# Patient Record
Sex: Male | Born: 1990 | Hispanic: No | Marital: Single | Smoking: Current every day smoker
Health system: Southern US, Community
[De-identification: ages and names within clinical notes are randomized; demographics above are authoritative.]

---
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
## 2011-05-18 ENCOUNTER — Emergency Department: Payer: Self-pay | Admitting: Internal Medicine

## 2011-07-13 ENCOUNTER — Emergency Department: Payer: Self-pay | Admitting: Unknown Physician Specialty

## 2016-11-20 ENCOUNTER — Emergency Department
Admission: EM | Admit: 2016-11-20 | Discharge: 2016-11-20 | Disposition: A | Discharge status: Home / Self Care | Payer: BLUE CROSS/BLUE SHIELD | Attending: Emergency Medicine | Admitting: Emergency Medicine

## 2016-11-20 DIAGNOSIS — R05 Cough: Secondary | ICD-10-CM | POA: Diagnosis present

## 2016-11-20 DIAGNOSIS — J069 Acute upper respiratory infection, unspecified: Secondary | ICD-10-CM

## 2016-11-20 DIAGNOSIS — Z87891 Personal history of nicotine dependence: Secondary | ICD-10-CM | POA: Diagnosis not present

## 2016-11-20 DIAGNOSIS — B9789 Other viral agents as the cause of diseases classified elsewhere: Secondary | ICD-10-CM

## 2016-11-20 MED ORDER — BENZONATATE 100 MG PO CAPS: ORAL_CAPSULE | ORAL | 0 refills | Status: DC

## 2016-11-20 MED ORDER — IBUPROFEN 800 MG PO TABS: 800.0000 mg | ORAL_TABLET | Freq: Three times a day (TID) | ORAL | 0 refills | Status: DC

## 2016-11-20 NOTE — ED Triage Notes (Signed)
Pt c/o cough with congestion and upper back pain with the cough for the past 3-4 days.

## 2016-11-20 NOTE — Discharge Instructions (Signed)
Follow-up with your primary care doctor or the clinic if any continued problems. Increase fluids. Ibuprofen 800 mg 3 times a day with food as needed for pain. Tessalon Perles 1 or 2 every 8 hours as needed for cough.

## 2016-11-20 NOTE — ED Notes (Signed)
Pt states "when I cough my back hurts. It feels like it's in my lungs." Pt states symptoms x 3 days. Denies coughing up blood. States pain with deep breath. Pt alert and oriented.

## 2016-11-20 NOTE — ED Provider Notes (Signed)
[facility] Emergency Department Provider Note   ____________________________________________   First MD Initiated Contact with Patient 11/20/16 1155     (approximate)  I have reviewed the triage vital signs and the nursing notes.   HISTORY  Chief Complaint Cough    HPI Alejandro Mcdonald is a 26 y.o. male is here with complaint of cough and congestion for the last 3-4 days. He also has some upper back pain with the cough. He denies any known fever or chills. He states that he discontinued smoking approximately 2 years ago. He denies any known bronchitis or pneumonia in the past but is concerned about both. Patient has not been taking any over-the-counter medication for his symptoms. Currently rates his pain as 6/10.   History reviewed. No pertinent past medical history.  There are no active problems to display for this patient.   History reviewed. No pertinent surgical history.  Prior to Admission medications   Medication Sig Start Date End Date Taking? Authorizing Provider  benzonatate (TESSALON PERLES) 100 MG capsule Take 1 or 2 q 8 h prn cough 11/20/16   Tommi Rumps, PA-C  ibuprofen (ADVIL,MOTRIN) 800 MG tablet Take 1 tablet (800 mg total) by mouth 3 (three) times daily. 11/20/16   Tommi Rumps, PA-C    Allergies Bee venom and Shellfish allergy  No family history on file.  Social History Social History  Substance Use Topics  . Smoking status: Former Games developer  . Smokeless tobacco: Never Used  . Alcohol use No    Review of Systems Constitutional: No fever/chills ENT: No sore throat.No ear pain. Cardiovascular: Denies chest pain. Respiratory: Denies shortness of breath. Positive cough. Gastrointestinal: No abdominal pain.  No nausea, no vomiting.   Musculoskeletal: Negative for back pain. Skin: Negative for rash. Neurological: Negative for headaches, focal weakness or numbness.  10-point ROS otherwise  negative.  ____________________________________________   PHYSICAL EXAM:  VITAL SIGNS: ED Triage Vitals  Enc Vitals Group     BP 11/20/16 1038 (!) 141/84     Pulse Rate 11/20/16 1038 78     Resp 11/20/16 1038 18     Temp 11/20/16 1038 98.2 F (36.8 C)     Temp Source 11/20/16 1038 Oral     SpO2 11/20/16 1038 97 %     Weight 11/20/16 1038 (!) 320 lb (145.2 kg)     Height 11/20/16 1038 5\' 6"  (1.676 m)     Head Circumference --      Peak Flow --      Pain Score 11/20/16 1039 6     Pain Loc --      Pain Edu? --      Excl. in GC? --     Constitutional: Alert and oriented. Well appearing and in no acute distress. Eyes: Conjunctivae are normal. PERRL. EOMI. Head: Atraumatic. Nose: No congestion/rhinnorhea. EACs are clear bilaterally. TMs are dull. No erythema or injection seen. Mouth/Throat: Mucous membranes are moist.  Oropharynx non-erythematous. Neck: No stridor.   Hematological/Lymphatic/Immunilogical: No cervical lymphadenopathy. Cardiovascular: Normal rate, regular rhythm. Grossly normal heart sounds.  Good peripheral circulation. Respiratory: Normal respiratory effort.  No retractions. Lungs CTAB. Gastrointestinal: Soft and nontender. No distention.  Musculoskeletal: No lower extremity tenderness nor edema.  No joint effusions. Neurologic:  Normal speech and language. No gross focal neurologic deficits are appreciated. No gait instability. Skin:  Skin is warm, dry and intact. No rash noted. Psychiatric: Mood and affect are normal. Speech and behavior are normal.  ____________________________________________   LABS (all labs ordered are listed, but only abnormal results are displayed)  Labs Reviewed - No data to display  PROCEDURES  Procedure(s) performed: None  Procedures  Critical Care performed: No  ____________________________________________   INITIAL IMPRESSION / ASSESSMENT AND PLAN / ED COURSE  Pertinent labs & imaging results that were available  during my care of the patient were reviewed by me and considered in my medical decision making (see chart for details).  Patient is given prescription for Tessalon Perles 1 or 2 every 8 hours as needed for cough. He is also given a prescription for ibuprofen 800 mg 3 times a day with food. He was reassured that he does not have influenza but most likely a viral upper respiratory infection. He is to increase fluids and follow-up with [facility] if any continued problems.      ____________________________________________   FINAL CLINICAL IMPRESSION(S) / ED DIAGNOSES  Final diagnoses:  Viral URI with cough      NEW MEDICATIONS STARTED DURING THIS VISIT:  Discharge Medication List as of 11/20/2016 12:32 PM    START taking these medications   Details  benzonatate (TESSALON PERLES) 100 MG capsule Take 1 or 2 q 8 h prn cough, Print    ibuprofen (ADVIL,MOTRIN) 800 MG tablet Take 1 tablet (800 mg total) by mouth 3 (three) times daily., Starting Wed 11/20/2016, Print         Note:  This document was prepared using Dragon voice recognition software and may include unintentional dictation errors.    Tommi Rumpshonda L Kanyon Bunn, PA-C 11/20/16 1317    Jeanmarie PlantJames A McShane, MD 11/20/16 [phone]

## 2018-01-08 ENCOUNTER — Emergency Department
Admission: EM | Admit: 2018-01-08 | Discharge: 2018-01-08 | Disposition: A | Discharge status: Home / Self Care | Payer: BLUE CROSS/BLUE SHIELD | Attending: Emergency Medicine | Admitting: Emergency Medicine

## 2018-01-08 ENCOUNTER — Other Ambulatory Visit: Payer: Self-pay

## 2018-01-08 ENCOUNTER — Emergency Department: Payer: BLUE CROSS/BLUE SHIELD

## 2018-01-08 DIAGNOSIS — M722 Plantar fascial fibromatosis: Secondary | ICD-10-CM | POA: Diagnosis not present

## 2018-01-08 DIAGNOSIS — Z79899 Other long term (current) drug therapy: Secondary | ICD-10-CM | POA: Insufficient documentation

## 2018-01-08 DIAGNOSIS — M79671 Pain in right foot: Secondary | ICD-10-CM | POA: Diagnosis present

## 2018-01-08 DIAGNOSIS — Z87891 Personal history of nicotine dependence: Secondary | ICD-10-CM | POA: Diagnosis not present

## 2018-01-08 MED ORDER — TRAMADOL HCL 50 MG PO TABS: 50.0000 mg | ORAL_TABLET | Freq: Four times a day (QID) | ORAL | 0 refills | Status: AC | PRN

## 2018-01-08 MED ORDER — MELOXICAM 15 MG PO TABS: 15.0000 mg | ORAL_TABLET | Freq: Every day | ORAL | 0 refills | Status: DC

## 2018-01-08 NOTE — ED Provider Notes (Signed)
[facility] Emergency Department Provider Note ____________________________________________  Time seen: Approximately 3:23 PM  I have reviewed the triage vital signs and the nursing notes.   HISTORY  Chief Complaint Foot Pain    HPI Alejandro Mcdonald is a 27 y.o. male who presents to the emergency department for evaluation and treatment of who presents to the emergency department for treatment and evaluation of right heel pain. No specific injury. Pain is worse in the morning. No relief with ibuprofen.  History reviewed. No pertinent past medical history.  There are no active problems to display for this patient.   History reviewed. No pertinent surgical history.  Prior to Admission medications   Medication Sig Start Date End Date Taking? Authorizing Provider  benzonatate (TESSALON PERLES) 100 MG capsule Take 1 or 2 q 8 h prn cough 11/20/16   Bridget HartshornSummers, Rhonda L, PA-C  ibuprofen (ADVIL,MOTRIN) 800 MG tablet Take 1 tablet (800 mg total) by mouth 3 (three) times daily. 11/20/16   Tommi RumpsSummers, Rhonda L, PA-C  meloxicam (MOBIC) 15 MG tablet Take 1 tablet (15 mg total) by mouth daily. 01/08/18   Shawnta Schlegel, Rulon Eisenmengerari B, FNP  traMADol (ULTRAM) 50 MG tablet Take 1 tablet (50 mg total) by mouth every 6 (six) hours as needed. 01/08/18   Marquis Down, Kasandra Knudsenari B, FNP    Allergies Bee venom and Shellfish allergy  History reviewed. No pertinent family history.  Social History Social History   Tobacco Use  . Smoking status: Former Games developermoker  . Smokeless tobacco: Never Used  Substance Use Topics  . Alcohol use: No  . Drug use: Not on file    Review of Systems Constitutional: Negative for fever. Cardiovascular: Negative for chest pain. Respiratory: Negative for shortness of breath. Musculoskeletal: Positive for right foot pain. Skin:  Negative for rash, lesion, or wound  Neurological: Negative for decrease in sensation  ____________________________________________   PHYSICAL  EXAM:  VITAL SIGNS: ED Triage Vitals  Enc Vitals Group     BP 01/08/18 1504 (!) 144/85     Pulse Rate 01/08/18 1504 73     Resp 01/08/18 1504 18     Temp 01/08/18 1504 99.4 F (37.4 C)     Temp Source 01/08/18 1504 Oral     SpO2 01/08/18 1504 96 %     Weight 01/08/18 1503 (!) 315 lb (142.9 kg)     Height 01/08/18 1503 5\' 6"  (1.676 m)     Head Circumference --      Peak Flow --      Pain Score 01/08/18 1503 7     Pain Loc --      Pain Edu? --      Excl. in GC? --     Constitutional: Alert and oriented. Well appearing and in no acute distress. Eyes: Conjunctivae are clear without discharge or drainage Head: Atraumatic Neck: Supple Respiratory: No cough. Respirations are even and unlabored. Musculoskeletal: Tenderness over plantar aspect of right heel and medial aspect of right foot and ankle. Neurologic: Motor and sensory function intact.  Skin: Intact  Psychiatric: Affect and behavior are appropriate.  ____________________________________________   LABS (all labs ordered are listed, but only abnormal results are displayed)  Labs Reviewed - No data to display ____________________________________________  RADIOLOGY  Tiny dorsal and plantar calcaneal spurs, otherwise negative per radiology. I, Kem Boroughsari Deep Bonawitz, personally viewed and evaluated these images (plain radiographs) as part of my medical decision making, as well as reviewing the written report by the radiologist. ________________________________________  PROCEDURES  Procedures  ____________________________________________   INITIAL IMPRESSION / ASSESSMENT AND PLAN / ED COURSE  Alejandro Mcdonald is a 27 y.o. who presents to the emergency department for treatment of right foot pain. Symptoms and exam most consistent with plantar fasciitis. Will get imaging.  Patient instructed to follow-up with podiatry in  1 week if not improving.  He was also instructed to return to the emergency department for symptoms  that change or worsen if unable schedule an appointment with orthopedics or primary care.  Medications - No data to display  Pertinent labs & imaging results that were available during my care of the patient were reviewed by me and considered in my medical decision making (see chart for details).  _________________________________________   FINAL CLINICAL IMPRESSION(S) / ED DIAGNOSES  Final diagnoses:  Plantar fasciitis    ED Discharge Orders        Ordered    meloxicam (MOBIC) 15 MG tablet  Daily     01/08/18 1554    traMADol (ULTRAM) 50 MG tablet  Every 6 hours PRN     01/08/18 1554       If controlled substance prescribed during this visit, 12 month history viewed on the [facility] prior to issuing an initial prescription for Schedule II or III opiod.    Chinita Pester, FNP 01/08/18 1559    Minna Antis, MD 01/09/18 0004

## 2018-01-08 NOTE — ED Triage Notes (Signed)
Pt c/o of R heel pain. States too painful to put pressure on. In wheelchair. Alert, oriented. Denies injury or fall. Pain x 2 days.

## 2018-04-02 DIAGNOSIS — N3091 Cystitis, unspecified with hematuria: Secondary | ICD-10-CM | POA: Insufficient documentation

## 2018-04-02 NOTE — ED Triage Notes (Signed)
Patient c/o dysuria - burning with urination. Patient denies any sexual activity. Patient reports he took OTC urinary tract infection medication with some relief.

## 2018-04-03 ENCOUNTER — Emergency Department
Admission: EM | Admit: 2018-04-03 | Discharge: 2018-04-03 | Disposition: A | Discharge status: Home / Self Care | Payer: BLUE CROSS/BLUE SHIELD | Attending: Emergency Medicine | Admitting: Emergency Medicine

## 2018-04-03 DIAGNOSIS — N3001 Acute cystitis with hematuria: Secondary | ICD-10-CM

## 2018-04-03 LAB — URINALYSIS, COMPLETE (UACMP) WITH MICROSCOPIC
BACTERIA UA: NONE SEEN
BILIRUBIN URINE: NEGATIVE
Glucose, UA: NEGATIVE mg/dL
Hgb urine dipstick: NEGATIVE
KETONES UR: 5 mg/dL — AB
Nitrite: NEGATIVE
PH: 5 (ref 5.0–8.0)
Protein, ur: 100 mg/dL — AB
Specific Gravity, Urine: 1.026 (ref 1.005–1.030)

## 2018-04-03 LAB — CHLAMYDIA/NGC RT PCR (ARMC ONLY)
CHLAMYDIA TR: NOT DETECTED
N gonorrhoeae: NOT DETECTED

## 2018-04-03 MED ORDER — PHENAZOPYRIDINE HCL 200 MG PO TABS
200.0000 mg | ORAL_TABLET | Freq: Once | ORAL | Status: AC
  Administered 2018-04-03: 200 mg via ORAL
  Filled 2018-04-03: qty 1

## 2018-04-03 MED ORDER — CEPHALEXIN 500 MG PO CAPS: 500.0000 mg | ORAL_CAPSULE | Freq: Two times a day (BID) | ORAL | 0 refills | Status: AC

## 2018-04-03 MED ORDER — CEPHALEXIN 500 MG PO CAPS
500.0000 mg | ORAL_CAPSULE | Freq: Once | ORAL | Status: AC
  Administered 2018-04-03: 500 mg via ORAL
  Filled 2018-04-03: qty 1

## 2018-04-03 NOTE — ED Notes (Signed)
Pt discharged to home.  Family member driving.  Discharge instructions reviewed.  Verbalized understanding.  No questions or concerns at this time.  Teach back verified.  Pt in NAD.  No items left in ED.   

## 2018-04-03 NOTE — ED Notes (Signed)
Pt c/o pain/burning with urination.  Pt is A&Ox4, in NAD.

## 2018-04-03 NOTE — ED Provider Notes (Signed)
[facility] Emergency Department Provider Note    First MD Initiated Contact with Patient 04/03/18 [phone]     (approximate)  I have reviewed the triage vital signs and the nursing notes.   HISTORY  Chief Complaint Dysuria    HPI Alejandro Mcdonald is a 27 y.o. male presents to the emergency department with a one-week history of dysuria.  Patient states that he is taking over-the-counter "urinary tract infection medication with some relief however burning sensation has persisted.  Patient denies any fever.  Patient denies any back pain nausea or vomiting.  Patient denies any hematuria.   Past medical history UTI There are no active problems to display for this patient.   Past surgical history Noncontributory  Prior to Admission medications   Medication Sig Start Date End Date Taking? Authorizing Provider  benzonatate (TESSALON PERLES) 100 MG capsule Take 1 or 2 q 8 h prn cough 11/20/16   Bridget Hartshorn L, PA-C  ibuprofen (ADVIL,MOTRIN) 800 MG tablet Take 1 tablet (800 mg total) by mouth 3 (three) times daily. 11/20/16   Tommi Rumps, PA-C  meloxicam (MOBIC) 15 MG tablet Take 1 tablet (15 mg total) by mouth daily. 01/08/18   Triplett, Rulon Eisenmenger B, FNP  traMADol (ULTRAM) 50 MG tablet Take 1 tablet (50 mg total) by mouth every 6 (six) hours as needed. 01/08/18   Kem Boroughs B, FNP    Allergies Bee venom and Shellfish allergy  No family history on file.  Social History Social History   Tobacco Use  . Smoking status: Former Games developer  . Smokeless tobacco: Never Used  Substance Use Topics  . Alcohol use: No  . Drug use: Not on file    Review of Systems Constitutional: No fever/chills Eyes: No visual changes. ENT: No sore throat. Cardiovascular: Denies chest pain. Respiratory: Denies shortness of breath. Gastrointestinal: No abdominal pain.  No nausea, no vomiting.  No diarrhea.  No constipation. Genitourinary: Positive for  dysuria. Musculoskeletal: Negative for neck pain.  Negative for back pain. Integumentary: Negative for rash. Neurological: Negative for headaches, focal weakness or numbness.   ____________________________________________   PHYSICAL EXAM:  VITAL SIGNS: ED Triage Vitals  Enc Vitals Group     BP 04/02/18 2354 (!) 154/63     Pulse Rate 04/02/18 2354 82     Resp 04/02/18 2354 18     Temp 04/02/18 2354 100.2 F (37.9 C)     Temp Source 04/02/18 2354 Oral     SpO2 04/02/18 2354 95 %     Weight 04/02/18 2355 (!) 142.9 kg (315 lb)     Height --      Head Circumference --      Peak Flow --      Pain Score 04/02/18 2355 2     Pain Loc --      Pain Edu? --      Excl. in GC? --     Constitutional: Alert and oriented. Well appearing and in no acute distress. Eyes: Conjunctivae are normal.  Head: Atraumatic. Mouth/Throat: Mucous membranes are moist. Oropharynx non-erythematous. Neck: No stridor.   Cardiovascular: Normal rate, regular rhythm. Good peripheral circulation. Grossly normal heart sounds. Respiratory: Normal respiratory effort.  No retractions. Lungs CTAB. Gastrointestinal: Soft and nontender. No distention.  Musculoskeletal: No lower extremity tenderness nor edema. No gross deformities of extremities. Neurologic:  Normal speech and language. No gross focal neurologic deficits are appreciated.  Skin:  Skin is warm, dry and intact. No rash noted.  ____________________________________________   LABS (all labs ordered are listed, but only abnormal results are displayed)  Labs Reviewed  URINALYSIS, COMPLETE (UACMP) WITH MICROSCOPIC - Abnormal; Notable for the following components:      Result Value   Color, Urine YELLOW (*)    APPearance CLOUDY (*)    Ketones, ur 5 (*)    Protein, ur 100 (*)    Leukocytes, UA LARGE (*)    WBC, UA >50 (*)    All other components within normal limits  CHLAMYDIA/NGC RT PCR ([facility] ONLY)        Procedures   ____________________________________________   INITIAL IMPRESSION / ASSESSMENT AND PLAN / ED COURSE  As part of my medical decision making, I reviewed the following data within the electronic MEDICAL RECORD NUMBER   27 year old male presented with above-stated history and physical exam secondary to dysuria.  Concern for possible urinary tract infection versus sexual transmitted infection.  Urinalysis consistent with a UTI.  Gonorrhea and chlamydia negative.  Patient given Keflex in the emergency department will be prescribed the same for home.    ____________________________________________  FINAL CLINICAL IMPRESSION(S) / ED DIAGNOSES  Final diagnoses:  Acute cystitis with hematuria     MEDICATIONS GIVEN DURING THIS VISIT:  Medications - No data to display   ED Discharge Orders    None       Note:  This document was prepared using Dragon voice recognition software and may include unintentional dictation errors.    Darci CurrentBrown, O'Brien N, MD 04/03/18 [phone]

## 2018-08-23 IMAGING — DX DG FOOT COMPLETE 3+V*R*
3 series · 3 of 3 positions shown · non-contrast
Comparison: None.

CLINICAL DATA: Right heel pain for 2 days.  No known injury.

EXAM:
RIGHT FOOT COMPLETE - 3+ VIEW

[foot ap]
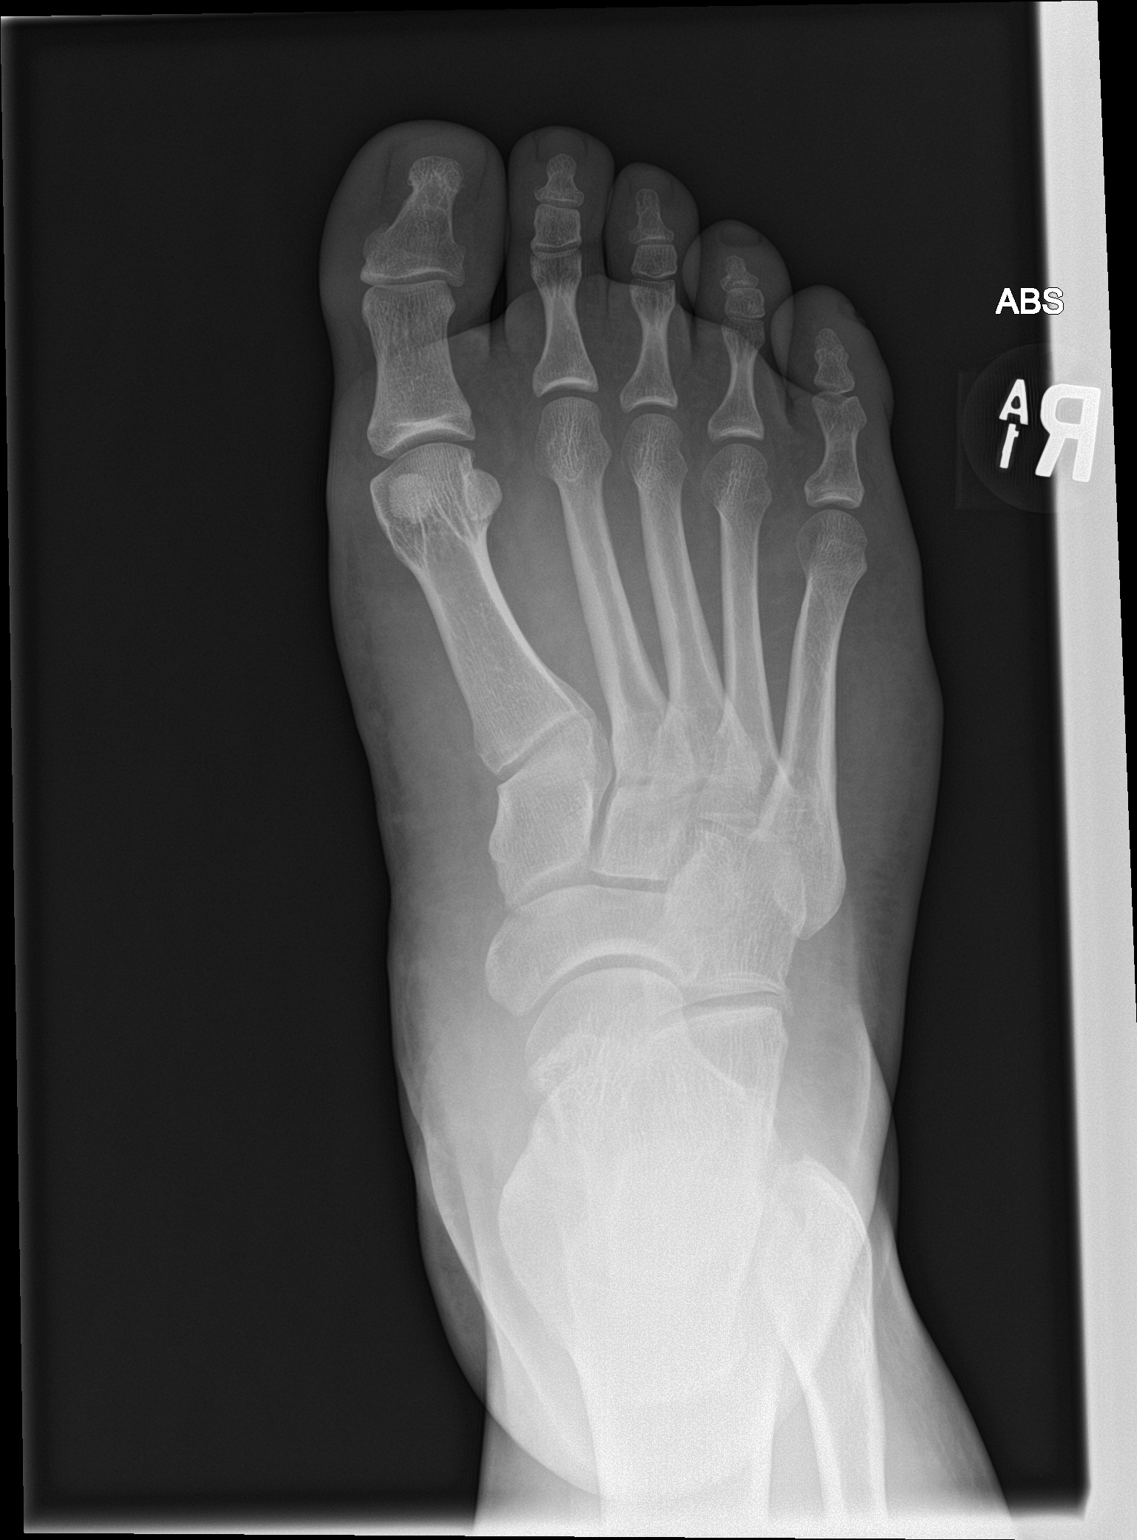

[foot obl]
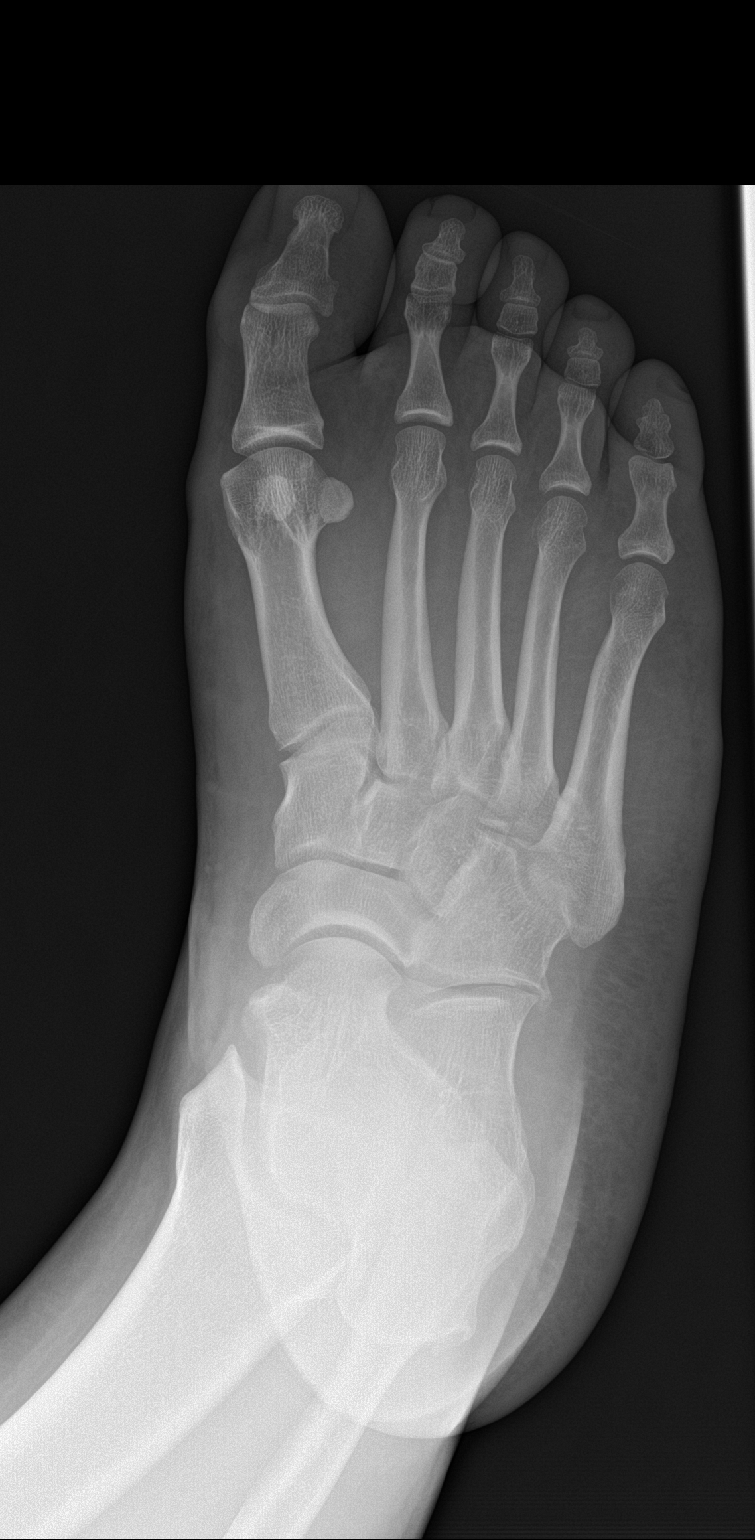

[foot lat]
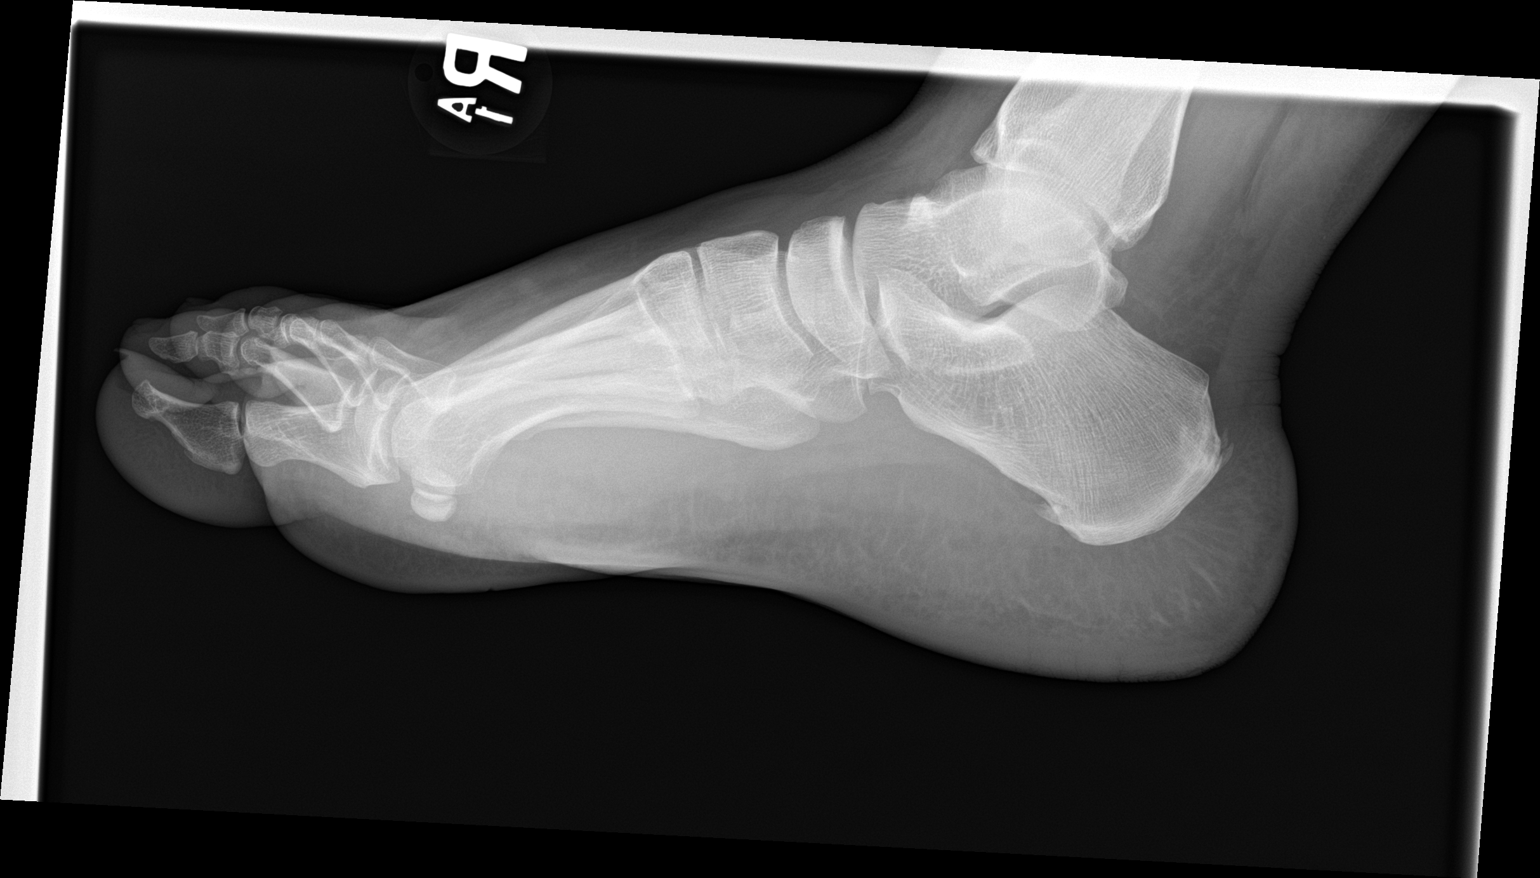

[3 of 3 positions shown; findings below may reference images not displayed]

FINDINGS: There is no evidence of fracture or dislocation. There is no
evidence of arthropathy or other focal bone abnormality. Tiny dorsal
and plantar calcaneal spurs are noted. Soft tissues are
unremarkable.
IMPRESSION: Tiny dorsal and plantar calcaneal spurs.  Otherwise negative.

## 2019-03-05 ENCOUNTER — Other Ambulatory Visit: Payer: Self-pay

## 2019-03-05 ENCOUNTER — Emergency Department
Admission: EM | Admit: 2019-03-05 | Discharge: 2019-03-05 | Disposition: A | Discharge status: Home / Self Care | Payer: Self-pay | Attending: Emergency Medicine | Admitting: Emergency Medicine

## 2019-03-05 ENCOUNTER — Encounter: Payer: Self-pay | Admitting: Emergency Medicine

## 2019-03-05 DIAGNOSIS — Z87891 Personal history of nicotine dependence: Secondary | ICD-10-CM | POA: Insufficient documentation

## 2019-03-05 DIAGNOSIS — R809 Proteinuria, unspecified: Secondary | ICD-10-CM | POA: Insufficient documentation

## 2019-03-05 DIAGNOSIS — N3000 Acute cystitis without hematuria: Secondary | ICD-10-CM | POA: Insufficient documentation

## 2019-03-05 LAB — BASIC METABOLIC PANEL
Anion gap: 8 (ref 5–15)
BUN: 14 mg/dL (ref 6–20)
CO2: 29 mmol/L (ref 22–32)
Calcium: 9.3 mg/dL (ref 8.9–10.3)
Chloride: 104 mmol/L (ref 98–111)
Creatinine, Ser: 1.19 mg/dL (ref 0.61–1.24)
GFR calc Af Amer: >60 mL/min (ref >60)
GFR calc non Af Amer: >60 mL/min (ref >60)
Glucose, Bld: 113 mg/dL — ABNORMAL HIGH (ref 70–99)
Potassium: 3.6 mmol/L (ref 3.5–5.1)
Sodium: 141 mmol/L (ref 135–145)

## 2019-03-05 LAB — URINALYSIS, COMPLETE (UACMP) WITH MICROSCOPIC
Bacteria, UA: NONE SEEN
Bilirubin Urine: NEGATIVE
Glucose, UA: NEGATIVE mg/dL
Hgb urine dipstick: NEGATIVE
Ketones, ur: NEGATIVE mg/dL
Nitrite: NEGATIVE
Protein, ur: 100 mg/dL — AB
Specific Gravity, Urine: 1.019 (ref 1.005–1.030)
pH: 6 (ref 5.0–8.0)

## 2019-03-05 LAB — CBC
HCT: 43.3 % (ref 39.0–52.0)
Hemoglobin: 13.9 g/dL (ref 13.0–17.0)
MCH: 25.4 pg — ABNORMAL LOW (ref 26.0–34.0)
MCHC: 32.1 g/dL (ref 30.0–36.0)
MCV: 79 fL — ABNORMAL LOW (ref 80.0–100.0)
Platelets: 343 10*3/uL (ref 150–400)
RBC: 5.48 MIL/uL (ref 4.22–5.81)
RDW: 14.2 % (ref 11.5–15.5)
WBC: 11.8 10*3/uL — ABNORMAL HIGH (ref 4.0–10.5)
nRBC: 0 % (ref 0.0–0.2)

## 2019-03-05 LAB — CHLAMYDIA/NGC RT PCR (ARMC ONLY)??????????: Chlamydia Tr: NOT DETECTED

## 2019-03-05 LAB — CHLAMYDIA/NGC RT PCR (ARMC ONLY): N gonorrhoeae: NOT DETECTED

## 2019-03-05 MED ORDER — CEPHALEXIN 500 MG PO CAPS
500.0000 mg | ORAL_CAPSULE | Freq: Once | ORAL | Status: AC
  Administered 2019-03-05: 500 mg via ORAL
  Filled 2019-03-05: qty 1

## 2019-03-05 MED ORDER — CEPHALEXIN 500 MG PO CAPS: 500.0000 mg | ORAL_CAPSULE | Freq: Two times a day (BID) | ORAL | 0 refills | Status: AC

## 2019-03-05 NOTE — ED Notes (Signed)
Pt uprite on stretcher in exam room with no distress noted; pt reports dysuria x wk; denies any accomp symptoms

## 2019-03-05 NOTE — ED Triage Notes (Signed)
PT c/o dysuria x1wk. PT states same symptoms in past with UTI. PT appears diaphoretic, states he has been doing house work and is a little nervous. Denies fever. VSS

## 2019-03-05 NOTE — ED Provider Notes (Signed)
[facility] Emergency Department Provider Note    First MD Initiated Contact with Patient 03/05/19 0411     (approximate)  I have reviewed the triage vital signs and the nursing notes.   HISTORY  Chief Complaint Dysuria    HPI Alejandro Mcdonald is a 28 y.o. male presents to the emergency department for 1 week history of dysuria.  Patient states that symptoms are consistent with previous episodes of urinary tract infection.  Patient does admit to being sexually active however the patient states that he does use a condom with each encounter.        History reviewed. No pertinent past medical history.  There are no active problems to display for this patient.   History reviewed. No pertinent surgical history.  Prior to Admission medications   Medication Sig Start Date End Date Taking? Authorizing Provider  benzonatate (TESSALON PERLES) 100 MG capsule Take 1 or 2 q 8 h prn cough 11/20/16   Bridget HartshornSummers, Rhonda L, PA-C  ibuprofen (ADVIL,MOTRIN) 800 MG tablet Take 1 tablet (800 mg total) by mouth 3 (three) times daily. 11/20/16   Tommi RumpsSummers, Rhonda L, PA-C  meloxicam (MOBIC) 15 MG tablet Take 1 tablet (15 mg total) by mouth daily. 01/08/18   Triplett, Rulon Eisenmengerari B, FNP  traMADol (ULTRAM) 50 MG tablet Take 1 tablet (50 mg total) by mouth every 6 (six) hours as needed. 01/08/18   Kem Boroughsriplett, Cari B, FNP    Allergies Bee venom and Shellfish allergy  No family history on file.  Social History Social History   Tobacco Use  . Smoking status: Former Games developermoker  . Smokeless tobacco: Never Used  Substance Use Topics  . Alcohol use: No  . Drug use: Never    Review of Systems Constitutional: No fever/chills Eyes: No visual changes. ENT: No sore throat. Cardiovascular: Denies chest pain. Respiratory: Denies shortness of breath. Gastrointestinal: No abdominal pain.  No nausea, no vomiting.  No diarrhea.  No constipation. Genitourinary: Negative for dysuria. Musculoskeletal:  Negative for neck pain.  Negative for back pain. Integumentary: Negative for rash. Neurological: Negative for headaches, focal weakness or numbness.   ____________________________________________   PHYSICAL EXAM:  VITAL SIGNS: ED Triage Vitals [03/05/19 0133]  Enc Vitals Group     BP      Pulse Rate 65     Resp 18     Temp 99.4 F (37.4 C)     Temp Source Oral     SpO2 95 %     Weight (!) 140.6 kg (310 lb)     Height      Head Circumference      Peak Flow      Pain Score 4     Pain Loc      Pain Edu?      Excl. in GC?     Constitutional: Alert and oriented. Well appearing and in no acute distress. Eyes: Conjunctivae are normal.  Mouth/Throat: Mucous membranes are moist.  Oropharynx non-erythematous. Neck: No stridor.   Cardiovascular: Normal rate, regular rhythm. Good peripheral circulation. Grossly normal heart sounds. Respiratory: Normal respiratory effort.  No retractions. No audible wheezing. Gastrointestinal: Soft and nontender. No distention.  Musculoskeletal: No lower extremity tenderness nor edema. No gross deformities of extremities. Neurologic:  Normal speech and language. No gross focal neurologic deficits are appreciated.  Skin:  Skin is warm, dry and intact. No rash noted. Psychiatric: Mood and affect are normal. Speech and behavior are normal.  ____________________________________________   LABS (  all labs ordered are listed, but only abnormal results are displayed)  Labs Reviewed  URINALYSIS, COMPLETE (UACMP) WITH MICROSCOPIC - Abnormal; Notable for the following components:      Result Value   Color, Urine YELLOW (*)    APPearance CLEAR (*)    Protein, ur 100 (*)    Leukocytes,Ua TRACE (*)    All other components within normal limits  BASIC METABOLIC PANEL - Abnormal; Notable for the following components:   Glucose, Bld 113 (*)    All other components within normal limits  CBC - Abnormal; Notable for the following components:   WBC 11.8 (*)     MCV 79.0 (*)    MCH 25.4 (*)    All other components within normal limits  CHLAMYDIA/NGC RT PCR ([facility] ONLY)      Procedures   ____________________________________________   INITIAL IMPRESSION / MDM / ASSESSMENT AND PLAN / ED COURSE  As part of my medical decision making, I reviewed the following data within the electronic MEDICAL RECORD NUMBER   28 year old male presenting with above-stated history and physical exam secondary to dysuria x1 week.  Considered possibly of UTI versus STI.  Gonorrhea chlamydia negative urinalysis did reveal leukocytosis      *Alejandro Mcdonald was evaluated in Emergency Department on 03/05/2019 for the symptoms described in the history of present illness. He was evaluated in the context of the global COVID-19 pandemic, which necessitated consideration that the patient might be at risk for infection with the SARS-CoV-2 virus that causes COVID-19. Institutional protocols and algorithms that pertain to the evaluation of patients at risk for COVID-19 are in a state of rapid change based on information released by regulatory bodies including the CDC and federal and state organizations. These policies and algorithms were followed during the patient's care in the ED.  Some ED evaluations and interventions may be delayed as a result of limited staffing during the pandemic.*        ____________________________________________  FINAL CLINICAL IMPRESSION(S) / ED DIAGNOSES  Final diagnoses:  Acute cystitis without hematuria     MEDICATIONS GIVEN DURING THIS VISIT:  Medications  cephALEXin (KEFLEX) capsule 500 mg (has no administration in time range)     ED Discharge Orders    None       Note:  This document was prepared using Dragon voice recognition software and may include unintentional dictation errors.   Darci Current, MD 03/05/19 [phone]

## 2019-03-19 LAB — MISC LABCORP TEST (SEND OUT): Labcorp test code: 182857

## 2019-03-26 LAB — SUSCEPTIBILITY RESULT

## 2019-03-27 LAB — URINE CULTURE: Culture: 10000 — AB

## 2019-04-02 LAB — SUSCEPTIBILITY, AER + ANAEROB

## 2021-08-06 ENCOUNTER — Ambulatory Visit (INDEPENDENT_AMBULATORY_CARE_PROVIDER_SITE_OTHER): Payer: BC Managed Care – PPO | Admitting: Podiatry

## 2021-08-06 ENCOUNTER — Encounter: Payer: Self-pay | Admitting: Podiatry

## 2021-08-06 ENCOUNTER — Other Ambulatory Visit: Payer: Self-pay | Admitting: Podiatry

## 2021-08-06 ENCOUNTER — Ambulatory Visit (INDEPENDENT_AMBULATORY_CARE_PROVIDER_SITE_OTHER): Payer: BC Managed Care – PPO

## 2021-08-06 ENCOUNTER — Ambulatory Visit: Payer: BC Managed Care – PPO | Admitting: Podiatry

## 2021-08-06 ENCOUNTER — Other Ambulatory Visit: Payer: Self-pay

## 2021-08-06 DIAGNOSIS — M7741 Metatarsalgia, right foot: Secondary | ICD-10-CM

## 2021-08-06 DIAGNOSIS — L84 Corns and callosities: Secondary | ICD-10-CM | POA: Diagnosis not present

## 2021-08-06 DIAGNOSIS — L03119 Cellulitis of unspecified part of limb: Secondary | ICD-10-CM

## 2021-08-06 DIAGNOSIS — L02611 Cutaneous abscess of right foot: Secondary | ICD-10-CM

## 2021-08-06 DIAGNOSIS — M7742 Metatarsalgia, left foot: Secondary | ICD-10-CM

## 2021-08-06 DIAGNOSIS — L02619 Cutaneous abscess of unspecified foot: Secondary | ICD-10-CM

## 2021-08-06 MED ORDER — CEPHALEXIN 500 MG PO CAPS: 500.0000 mg | ORAL_CAPSULE | Freq: Three times a day (TID) | ORAL | 0 refills | Status: DC

## 2021-08-06 NOTE — Progress Notes (Signed)
  Subjective:  Patient ID: BARBARA KENG, male    DOB: Mar 15, 1991,  MRN: [id number]  Chief Complaint  Patient presents with   Foot Pain    "I've got this thing growing on the bottom of my foot.  I thought it was a callus."    30 y.o. male presents with the above complaint. History confirmed with patient.  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 Left Foot: normal exam, no swelling, tenderness, instability; ligaments intact, full range of motion of all ankle/foot joints Right Foot: Macerated skin and porokeratosis lifted off, debridement of this reveals small ulceration and purulent drainage  No images are attached to the encounter.  Radiographs: Multiple views x-ray of the left foot: no fracture, dislocation, swelling or degenerative changes noted and no soft tissue emphysema, no signs of osteomyelitis Assessment:   1. Cellulitis and abscess of foot, except toes   2. Callus   3. Metatarsalgia of both feet      Plan:  Patient was evaluated and treated and all questions answered.  He had a small abscess and ulceration.  I debrided the lesion and draining the abscess.  Placed him on Keflex for 5 days recommend he dress with triple antibiotic ointment and a bandage.  He works on his feet for most the day and I discussed with him offloading with padding and possible custom molded orthoses and he will return to see me in 1 month for this for casting  Return in about 1 month (around 09/05/2021).

## 2021-08-12 LAB — WOUND CULTURE: Organism ID, Bacteria: NONE SEEN

## 2021-09-05 ENCOUNTER — Ambulatory Visit: Payer: BC Managed Care – PPO | Admitting: Podiatry

## 2021-09-19 ENCOUNTER — Ambulatory Visit (INDEPENDENT_AMBULATORY_CARE_PROVIDER_SITE_OTHER): Payer: Self-pay | Admitting: Podiatry

## 2021-09-19 DIAGNOSIS — Z91199 Patient's noncompliance with other medical treatment and regimen due to unspecified reason: Secondary | ICD-10-CM

## 2021-09-19 NOTE — Progress Notes (Signed)
Patient was no-show for appointment today 

## 2022-09-11 ENCOUNTER — Other Ambulatory Visit: Payer: Self-pay

## 2022-09-11 ENCOUNTER — Emergency Department
Admission: EM | Admit: 2022-09-11 | Discharge: 2022-09-11 | Disposition: A | Discharge status: Home / Self Care | Payer: Self-pay | Attending: Emergency Medicine | Admitting: Emergency Medicine

## 2022-09-11 DIAGNOSIS — N3090 Cystitis, unspecified without hematuria: Secondary | ICD-10-CM | POA: Insufficient documentation

## 2022-09-11 DIAGNOSIS — S39012A Strain of muscle, fascia and tendon of lower back, initial encounter: Secondary | ICD-10-CM | POA: Insufficient documentation

## 2022-09-11 DIAGNOSIS — X58XXXA Exposure to other specified factors, initial encounter: Secondary | ICD-10-CM | POA: Insufficient documentation

## 2022-09-11 LAB — URINALYSIS, ROUTINE W REFLEX MICROSCOPIC
Bacteria, UA: NONE SEEN
Bilirubin Urine: NEGATIVE
Glucose, UA: NEGATIVE mg/dL
Hgb urine dipstick: NEGATIVE
Ketones, ur: NEGATIVE mg/dL
Nitrite: NEGATIVE
Protein, ur: >=300 mg/dL — AB
Specific Gravity, Urine: 1.023 (ref 1.005–1.030)
WBC, UA: >50 WBC/hpf — ABNORMAL HIGH (ref 0–5)
pH: 6 (ref 5.0–8.0)

## 2022-09-11 MED ORDER — CEPHALEXIN 500 MG PO CAPS: 500.0000 mg | ORAL_CAPSULE | Freq: Three times a day (TID) | ORAL | 0 refills | Status: AC

## 2022-09-11 MED ORDER — NAPROXEN 500 MG PO TABS: 500.0000 mg | ORAL_TABLET | Freq: Two times a day (BID) | ORAL | 0 refills | Status: AC

## 2022-09-11 NOTE — ED Notes (Signed)
Patient declined discharge vital signs. 

## 2022-09-11 NOTE — ED Triage Notes (Signed)
Pt comes with c/o back pain that started two days ago. Pt states denies any known injury. Pt states lower back pain.   Pt states some pain and burning.

## 2022-09-11 NOTE — ED Provider Notes (Signed)
[facility] Provider Note    Event Date/Time   First MD Initiated Contact with Patient 09/11/22 1557     (approximate)   History   Chief Complaint: Back Pain   HPI  Alejandro Mcdonald is a 31 y.o. male with no significant past medical history who comes the ED complaining of right low back pain for the past week.  Worse with movement and standing and walking.  Denies falls but does worry that he strained a muscle while working his 2 jobs.  No lower extremity weakness or paresthesias, no bowel or bladder incontinence or retention.  Also endorses dysuria.  Reports a history of UTI.  No history of kidney stone.  Denies penile discharge or genital lesions.     Physical Exam   Triage Vital Signs: ED Triage Vitals  Enc Vitals Group     BP 09/11/22 1437 (!) 157/102     Pulse Rate 09/11/22 1437 (!) 59     Resp 09/11/22 1437 19     Temp 09/11/22 1437 97.8 F (36.6 C)     Temp src --      SpO2 09/11/22 1437 94 %     Weight --      Height --      Head Circumference --      Peak Flow --      Pain Score 09/11/22 1434 6     Pain Loc --      Pain Edu? --      Excl. in GC? --     Most recent vital signs: Vitals:   09/11/22 1437  BP: (!) 157/102  Pulse: (!) 59  Resp: 19  Temp: 97.8 F (36.6 C)  SpO2: 94%    General: Awake, no distress.  CV:  Good peripheral perfusion.  Resp:  Normal effort.  Abd:  No distention.  Soft nontender.  No CVA tenderness. Other:  At the right low back and superior edge of the gluteus, there is muscular tenderness which reproduces the pain.  No inflammatory changes or swelling or induration.   ED Results / Procedures / Treatments   Labs (all labs ordered are listed, but only abnormal results are displayed) Labs Reviewed  URINALYSIS, ROUTINE W REFLEX MICROSCOPIC - Abnormal; Notable for the following components:      Result Value   Color, Urine YELLOW (*)    APPearance CLOUDY (*)    Protein, ur >=300 (*)     Leukocytes,Ua MODERATE (*)    WBC, UA >50 (*)    All other components within normal limits     EKG    RADIOLOGY    PROCEDURES:  Procedures   MEDICATIONS ORDERED IN ED: Medications - No data to display   IMPRESSION / MDM / ASSESSMENT AND PLAN / ED COURSE  I reviewed the triage vital signs and the nursing notes.                              Differential diagnosis includes, but is not limited to, UTI, kidney stone, muscle strain.  Doubt STI, appendicitis, cholecystitis, bowel obstruction, diverticulitis, GI perforation, cauda equina, spinal fracture  Patient's presentation is most consistent with acute presentation with potential threat to life or bodily function.  Patient presents with right low back pain which clinically is consistent with a musculoskeletal pain.  He also reports dysuria and a history of UTIs, which I am not sure is connected.  Urinalysis is consistent with a UTI, negative for hematuria.  Will treat with NSAIDs and Keflex, recommend follow-up with primary care in a week.       FINAL CLINICAL IMPRESSION(S) / ED DIAGNOSES   Final diagnoses:  Strain of lumbar region, initial encounter  Cystitis     Rx / DC Orders   ED Discharge Orders          Ordered    cephALEXin (KEFLEX) 500 MG capsule  3 times daily        09/11/22 1653    naproxen (NAPROSYN) 500 MG tablet  2 times daily with meals        09/11/22 1653             Note:  This document was prepared using Dragon voice recognition software and may include unintentional dictation errors.   Sharman Cheek, MD 09/11/22 [id number]
